# Patient Record
Sex: Male | Born: 1980 | Race: White | Hispanic: No | Marital: Single | Smoking: Never smoker
Health system: Southern US, Community
[De-identification: ages and names within clinical notes are randomized; demographics above are authoritative.]

## PROBLEM LIST (undated history)

## (undated) DIAGNOSIS — N200 Calculus of kidney: Secondary | ICD-10-CM

## (undated) DIAGNOSIS — N289 Disorder of kidney and ureter, unspecified: Secondary | ICD-10-CM

---

## 2018-02-23 ENCOUNTER — Other Ambulatory Visit: Payer: Self-pay

## 2018-02-23 ENCOUNTER — Encounter (HOSPITAL_COMMUNITY): Payer: Self-pay | Admitting: *Deleted

## 2018-02-23 ENCOUNTER — Emergency Department (HOSPITAL_COMMUNITY)
Admission: EM | Admit: 2018-02-23 | Discharge: 2018-02-24 | Disposition: A | Discharge status: Home / Self Care | Payer: BLUE CROSS/BLUE SHIELD | Attending: Emergency Medicine | Admitting: Emergency Medicine

## 2018-02-23 DIAGNOSIS — R1012 Left upper quadrant pain: Secondary | ICD-10-CM | POA: Diagnosis present

## 2018-02-23 DIAGNOSIS — N201 Calculus of ureter: Secondary | ICD-10-CM | POA: Insufficient documentation

## 2018-02-23 DIAGNOSIS — N23 Unspecified renal colic: Secondary | ICD-10-CM

## 2018-02-23 DIAGNOSIS — Z79899 Other long term (current) drug therapy: Secondary | ICD-10-CM | POA: Insufficient documentation

## 2018-02-23 LAB — COMPREHENSIVE METABOLIC PANEL
ALK PHOS: 61 U/L (ref 38–126)
ALT: 22 U/L (ref 0–44)
ANION GAP: 11 (ref 5–15)
AST: 16 U/L (ref 15–41)
Albumin: 4.7 g/dL (ref 3.5–5.0)
BILIRUBIN TOTAL: 0.7 mg/dL (ref 0.3–1.2)
BUN: 19 mg/dL (ref 6–20)
CALCIUM: 9.4 mg/dL (ref 8.9–10.3)
CO2: 24 mmol/L (ref 22–32)
CREATININE: 1.69 mg/dL — AB (ref 0.61–1.24)
Chloride: 104 mmol/L (ref 98–111)
GFR calc non Af Amer: 49 mL/min — ABNORMAL LOW (ref >60)
GFR, EST AFRICAN AMERICAN: 57 mL/min — AB (ref >60)
Glucose, Bld: 123 mg/dL — ABNORMAL HIGH (ref 70–99)
Potassium: 3.9 mmol/L (ref 3.5–5.1)
Sodium: 139 mmol/L (ref 135–145)
TOTAL PROTEIN: 8.1 g/dL (ref 6.5–8.1)

## 2018-02-23 LAB — CBC WITH DIFFERENTIAL/PLATELET
BASOS PCT: 0 %
Basophils Absolute: 0 10*3/uL (ref 0.0–0.1)
EOS ABS: 0.1 10*3/uL (ref 0.0–0.7)
EOS PCT: 1 %
HCT: 43.3 % (ref 39.0–52.0)
HEMOGLOBIN: 14.5 g/dL (ref 13.0–17.0)
Lymphocytes Relative: 7 %
Lymphs Abs: 1.2 10*3/uL (ref 0.7–4.0)
MCH: 30.2 pg (ref 26.0–34.0)
MCHC: 33.5 g/dL (ref 30.0–36.0)
MCV: 90.2 fL (ref 78.0–100.0)
MONOS PCT: 5 %
Monocytes Absolute: 0.8 10*3/uL (ref 0.1–1.0)
NEUTROS PCT: 87 %
Neutro Abs: 14.1 10*3/uL — ABNORMAL HIGH (ref 1.7–7.7)
PLATELETS: 265 10*3/uL (ref 150–400)
RBC: 4.8 MIL/uL (ref 4.22–5.81)
RDW: 12.9 % (ref 11.5–15.5)
WBC: 16.3 10*3/uL — AB (ref 4.0–10.5)

## 2018-02-23 LAB — LIPASE, BLOOD: Lipase: 29 U/L (ref 11–51)

## 2018-02-23 MED ORDER — KETOROLAC TROMETHAMINE 30 MG/ML IJ SOLN
15.0000 mg | Freq: Once | INTRAMUSCULAR | Status: AC
  Administered 2018-02-23: 15 mg via INTRAVENOUS
  Filled 2018-02-23: qty 1

## 2018-02-23 MED ORDER — HYDROMORPHONE HCL 1 MG/ML IJ SOLN
1.0000 mg | INTRAMUSCULAR | Status: AC | PRN
  Administered 2018-02-23: 1 mg via INTRAVENOUS
  Filled 2018-02-23: qty 1

## 2018-02-23 MED ORDER — ONDANSETRON HCL 4 MG/2ML IJ SOLN
4.0000 mg | Freq: Once | INTRAMUSCULAR | Status: AC
  Administered 2018-02-23: 4 mg via INTRAVENOUS
  Filled 2018-02-23: qty 2

## 2018-02-23 MED ORDER — SODIUM CHLORIDE 0.9 % IV SOLN
INTRAVENOUS | Status: DC
  Administered 2018-02-23: 23:00:00 via INTRAVENOUS

## 2018-02-23 MED ORDER — SODIUM CHLORIDE 0.9 % IV BOLUS
500.0000 mL | Freq: Once | INTRAVENOUS | Status: AC
  Administered 2018-02-23: 1000 mL via INTRAVENOUS

## 2018-02-23 NOTE — ED Provider Notes (Signed)
[facility] EMERGENCY DEPARTMENT Provider Note   CSN: [id number] Arrival date & time: 02/23/18  2152     History   Chief Complaint Chief Complaint  Patient presents with  . Flank Pain    HPI Dionicio StallJoey Capelle is a 37 y.o. male.  HPI Patient presents to the emergency room for evaluation of acute onset flank pain.  Patient states his symptoms started about 3 hours ago.  He was not doing anything in particular.  He denies any heavy lifting or exertion.  Pain was sharp and it persisted.  Patient continues to have severe right flank pain.  The symptoms feel similar to previous kidney stones.  He denies any fevers.  No nausea vomiting.  No numbness or weakness.  No shortness of breath. History reviewed. No pertinent past medical history.  There are no active problems to display for this patient.   History reviewed. No pertinent surgical history.      Home Medications    Prior to Admission medications   Medication Sig Start Date End Date Taking? Authorizing Provider  ibuprofen (ADVIL,MOTRIN) 200 MG tablet Take 1,600 mg by mouth once as needed for mild pain or moderate pain.   Yes [provider]    Family History History reviewed. No pertinent family history.  Social History Social History   Tobacco Use  . Smoking status: Never Smoker  . Smokeless tobacco: Never Used  Substance Use Topics  . Alcohol use: Yes    Comment: occasionally  . Drug use: Never     Allergies   Patient has no known allergies.   Review of Systems Review of Systems  All other systems reviewed and are negative.    Physical Exam Updated Vital Signs BP (!) 141/80   Pulse 100   Temp (!) 97.5 F (36.4 C) (Oral)   Resp 20   Ht 1.702 m (5\' 7" )   Wt 88.9 kg (196 lb)   SpO2 97%   BMI 30.70 kg/m   Physical Exam  Constitutional: He appears well-developed and well-nourished. He appears distressed.  HENT:  Head: Normocephalic and atraumatic.  Right Ear: External ear normal.  Left  Ear: External ear normal.  Eyes: Conjunctivae are normal. Right eye exhibits no discharge. Left eye exhibits no discharge. No scleral icterus.  Neck: Neck supple. No tracheal deviation present.  Cardiovascular: Normal rate, regular rhythm and intact distal pulses.  Pulmonary/Chest: Effort normal and breath sounds normal. No stridor. No respiratory distress. He has no wheezes. He has no rales.  Abdominal: Soft. Bowel sounds are normal. He exhibits no distension. There is no tenderness. There is CVA tenderness (right). There is no rebound and no guarding.  Musculoskeletal: He exhibits no edema or tenderness.  Neurological: He is alert. He has normal strength. No cranial nerve deficit (no facial droop, extraocular movements intact, no slurred speech) or sensory deficit. He exhibits normal muscle tone. He displays no seizure activity. Coordination normal.  Skin: Skin is warm and dry. No rash noted.  Psychiatric: He has a normal mood and affect.  Nursing note and vitals reviewed.    ED Treatments / Results  Labs (all labs ordered are listed, but only abnormal results are displayed) Labs Reviewed  COMPREHENSIVE METABOLIC PANEL - Abnormal; Notable for the following components:      Result Value   Glucose, Bld 123 (*)    Creatinine, Ser 1.69 (*)    GFR calc non Af Amer 49 (*)    GFR calc Af Amer 57 (*)  All other components within normal limits  CBC WITH DIFFERENTIAL/PLATELET - Abnormal; Notable for the following components:   WBC 16.3 (*)    Neutro Abs 14.1 (*)    All other components within normal limits  URINALYSIS, ROUTINE W REFLEX MICROSCOPIC - Abnormal; Notable for the following components:   APPearance HAZY (*)    Specific Gravity, Urine 1.034 (*)    Hgb urine dipstick LARGE (*)    Ketones, ur 5 (*)    Protein, ur 30 (*)    RBC / HPF >50 (*)    All other components within normal limits  LIPASE, BLOOD    EKG None  Radiology No results found.  Procedures Procedures  (including critical care time)  Medications Ordered in ED Medications  sodium chloride 0.9 % bolus 500 mL (0 mLs Intravenous Stopped 02/24/18 0039)    And  0.9 %  sodium chloride infusion ( Intravenous New Bag/Given 02/23/18 2309)  HYDROmorphone (DILAUDID) injection 1 mg (1 mg Intravenous Given 02/24/18 0044)  HYDROmorphone (DILAUDID) injection 1 mg (1 mg Intravenous Given 02/23/18 2307)  ondansetron (ZOFRAN) injection 4 mg (4 mg Intravenous Given 02/23/18 2307)  ketorolac (TORADOL) 30 MG/ML injection 15 mg (15 mg Intravenous Given 02/23/18 2346)  HYDROmorphone (DILAUDID) injection 1 mg (1 mg Intravenous Given 02/24/18 0011)     Initial Impression / Assessment and Plan / ED Course  I have reviewed the triage vital signs and the nursing notes.  Pertinent labs & imaging results that were available during my care of the patient were reviewed by me and considered in my medical decision making (see chart for details).  Clinical Course as of Feb 25 43  Mon Feb 23, 2018  2232 [place] pmp awareL  last rx oct 2018. Overdose risk score 170   [JK]  Tue Feb 24, 2018  0001 Pt is still having pain.  Will give additional meds   [JK]  0037 Still having pain.  Will CT to evaluate further, additional meds ordered   [JK]    Clinical Course User Index [JK] Linwood Dibbles, MD    Patient presented to the emergency room for evaluation of flank pain.  Symptoms are consistent with renal colic.  Patient's laboratory tests were notable for hematuria as well as an elevated creatinine.  Patient was treated with pain medications.  Unfortunately symptoms persisted.  CT scan has been ordered for further evaluation and additional medications have also been ordered.  Case will be turned over to Dr. Blinda Leatherwood.  Final Clinical Impressions(s) / ED Diagnoses   Final diagnoses:  Renal colic    ED Discharge Orders    None       Linwood Dibbles, MD 02/24/18 [phone]

## 2018-02-23 NOTE — ED Triage Notes (Signed)
Pt c/o severe pain to right flank that started x 3hours ago; pt has a hx of kidney stones

## 2018-02-23 NOTE — ED Triage Notes (Signed)
Pt has a history of kidney stones x2. Stated this is the third time this has happened. Right flank pain started around 7pm. Said the pain was so  Bad he couldn't finish work and had to call his dad to pick him up. 10/10 severe pain.

## 2018-02-24 ENCOUNTER — Emergency Department (HOSPITAL_COMMUNITY): Payer: BLUE CROSS/BLUE SHIELD

## 2018-02-24 LAB — URINALYSIS, ROUTINE W REFLEX MICROSCOPIC
Bacteria, UA: NONE SEEN
Bilirubin Urine: NEGATIVE
GLUCOSE, UA: NEGATIVE mg/dL
KETONES UR: 5 mg/dL — AB
Leukocytes, UA: NEGATIVE
Nitrite: NEGATIVE
PROTEIN: 30 mg/dL — AB
RBC / HPF: >50 RBC/hpf — ABNORMAL HIGH (ref 0–5)
Specific Gravity, Urine: 1.034 — ABNORMAL HIGH (ref 1.005–1.030)
pH: 5 (ref 5.0–8.0)

## 2018-02-24 MED ORDER — HYDROMORPHONE HCL 1 MG/ML IJ SOLN
1.0000 mg | INTRAMUSCULAR | Status: AC | PRN
  Administered 2018-02-24: 1 mg via INTRAVENOUS
  Filled 2018-02-24: qty 1

## 2018-02-24 MED ORDER — KETOROLAC TROMETHAMINE 30 MG/ML IJ SOLN
INTRAMUSCULAR | Status: AC
  Filled 2018-02-24: qty 1

## 2018-02-24 MED ORDER — TAMSULOSIN HCL 0.4 MG PO CAPS: 0.4000 mg | ORAL_CAPSULE | Freq: Every day | ORAL | 0 refills | Status: AC

## 2018-02-24 MED ORDER — ONDANSETRON HCL 4 MG/2ML IJ SOLN
4.0000 mg | Freq: Once | INTRAMUSCULAR | Status: AC
  Administered 2018-02-24: 4 mg via INTRAVENOUS

## 2018-02-24 MED ORDER — ONDANSETRON HCL 4 MG/2ML IJ SOLN
INTRAMUSCULAR | Status: AC
  Filled 2018-02-24: qty 2

## 2018-02-24 MED ORDER — HYDROMORPHONE HCL 1 MG/ML IJ SOLN
1.0000 mg | INTRAMUSCULAR | Status: DC | PRN
Start: 2018-02-24 — End: 2018-02-24
  Administered 2018-02-24: 1 mg via INTRAVENOUS
  Filled 2018-02-24: qty 1

## 2018-02-24 MED ORDER — KETOROLAC TROMETHAMINE 30 MG/ML IJ SOLN
15.0000 mg | Freq: Once | INTRAMUSCULAR | Status: AC
  Administered 2018-02-24: 15 mg via INTRAVENOUS

## 2018-02-24 MED ORDER — OXYCODONE-ACETAMINOPHEN 5-325 MG PO TABS: 1.0000 | ORAL_TABLET | ORAL | 0 refills | Status: DC | PRN

## 2018-02-24 NOTE — ED Notes (Signed)
ED Provider at bedside. 

## 2018-02-24 NOTE — ED Provider Notes (Signed)
Patient signed out to me to follow-up on CT scan.  Patient seen with flank pain, presumed renal colic.  CT scan does confirm an obstructing 2 mm stone at the right ureterovesical junction with moderate hydronephrosis.  Will be initiated on Flomax, Percocet.  He will follow-up with urology.   Gilda CreasePollina, Christopher J, MD 02/24/18 [phone]

## 2018-02-25 MED FILL — Oxycodone w/ Acetaminophen Tab 5-325 MG: ORAL | Qty: 6 | Status: AC

## 2020-03-10 IMAGING — CT CT RENAL STONE PROTOCOL
2 of 4 series · 16 of 46 positions shown, 18 images · non-contrast
Comparison: None.

CLINICAL DATA: Flank pain, stone disease suspected. Pain initially
on the left now migrated to the right. History of kidney stones.

EXAM:
CT ABDOMEN AND PELVIS WITHOUT CONTRAST
TECHNIQUE: Multidetector CT imaging of the abdomen and pelvis was performed
following the standard protocol without IV contrast.

[Series 2: axial st · axial · 0.77mm/px · z∈[-934,-474]mm · 13 of 100 slices shown, 15 images]
[im 4/100  soft-tissue]
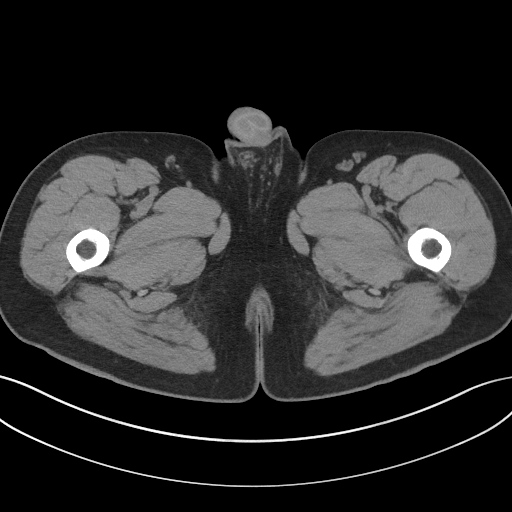
[im 4/100  bone]
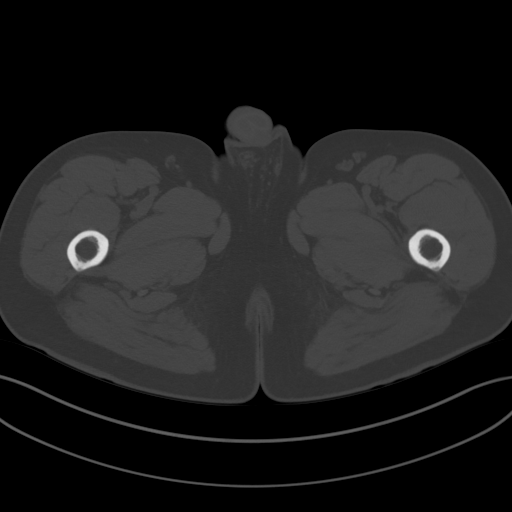
[im 12/100  soft-tissue]
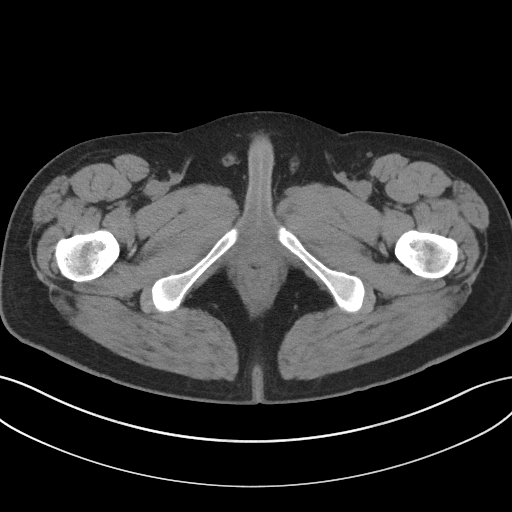
[im 20/100  soft-tissue]
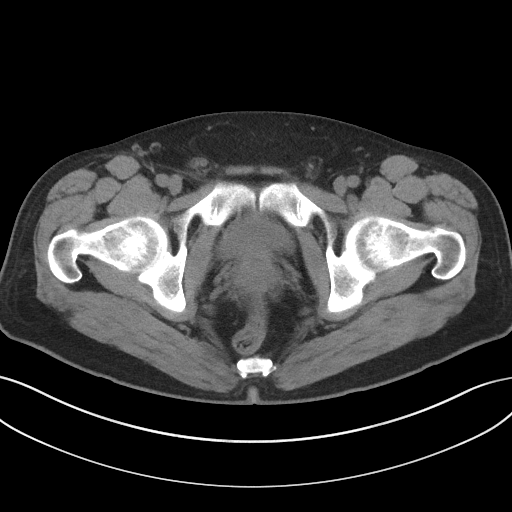
[im 28/100  soft-tissue]
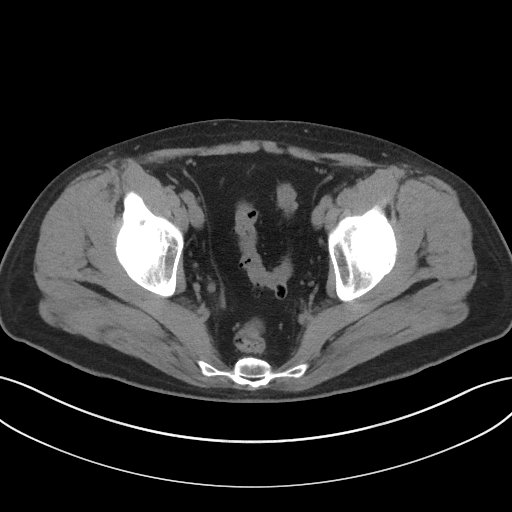
[im 36/100  soft-tissue]
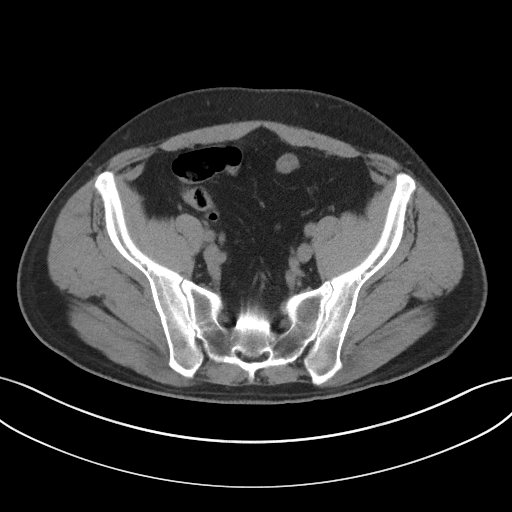
[im 44/100  soft-tissue]
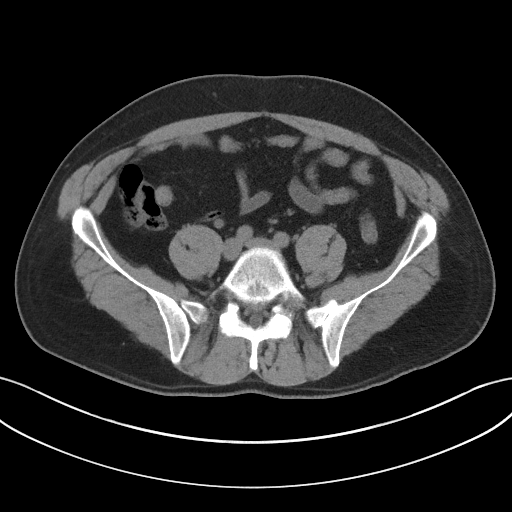
[im 52/100  soft-tissue]
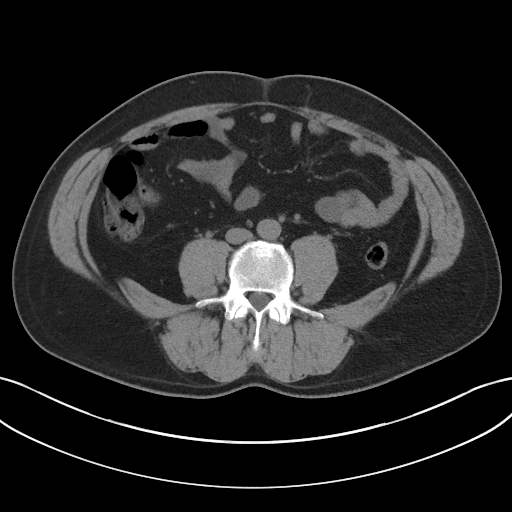
[im 56/100  soft-tissue]
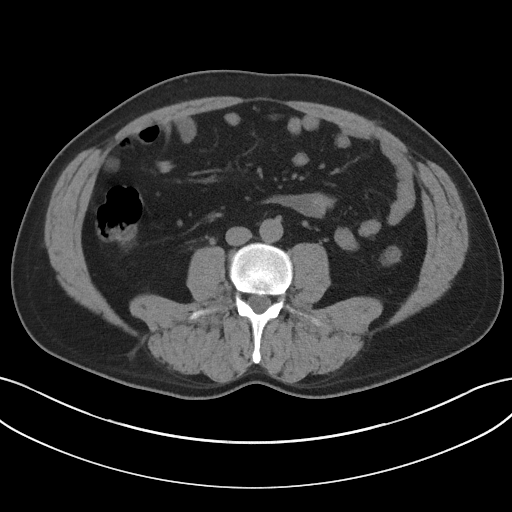
[im 64/100  soft-tissue]
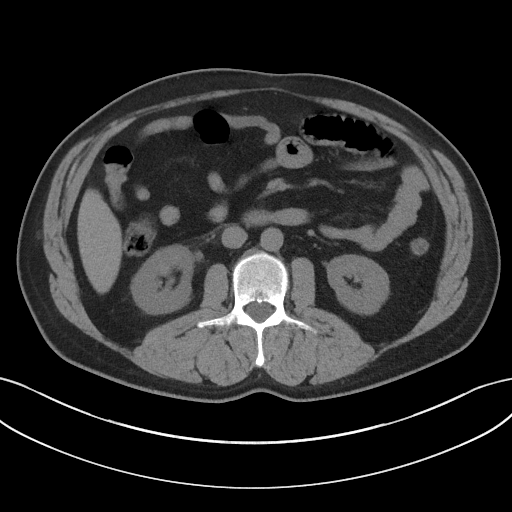
[im 64/100  bone]
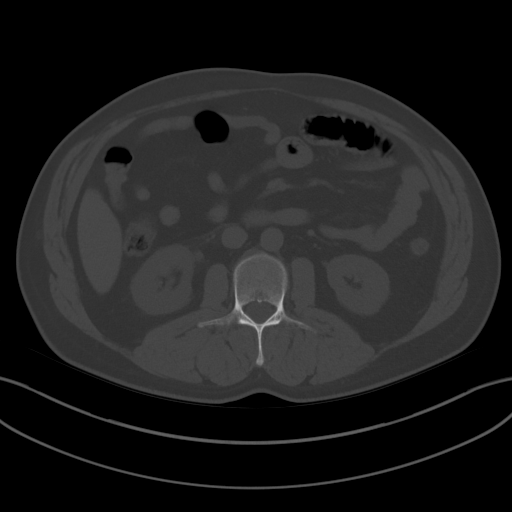
[im 72/100  soft-tissue]
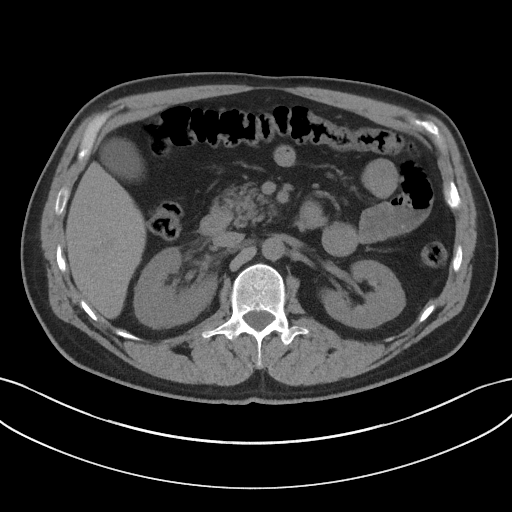
[im 80/100  soft-tissue]
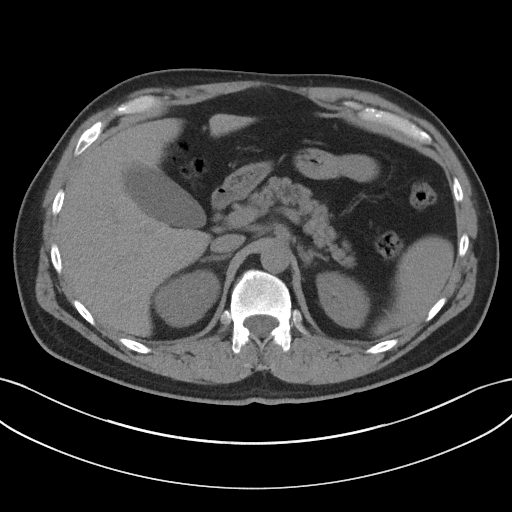
[im 88/100  soft-tissue]
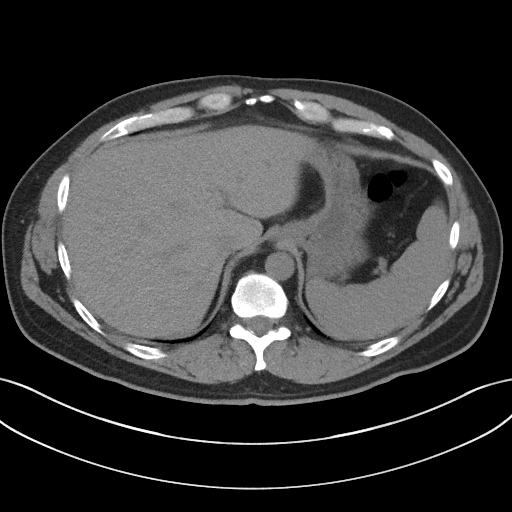
[im 96/100  soft-tissue]
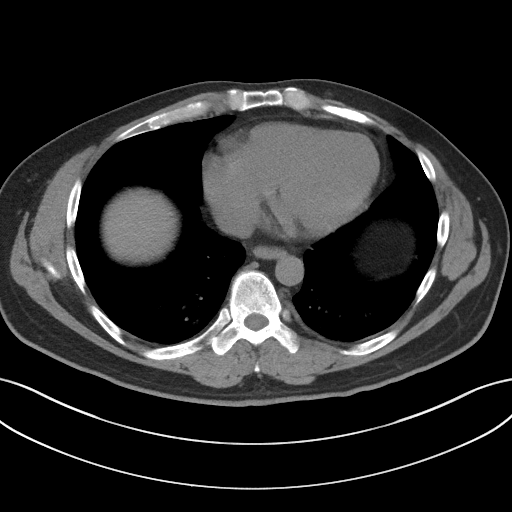

[Series 5: coronal st · coronal · 0.84mm/px · 3 of 93 slices shown]
[im 31/93  soft-tissue]
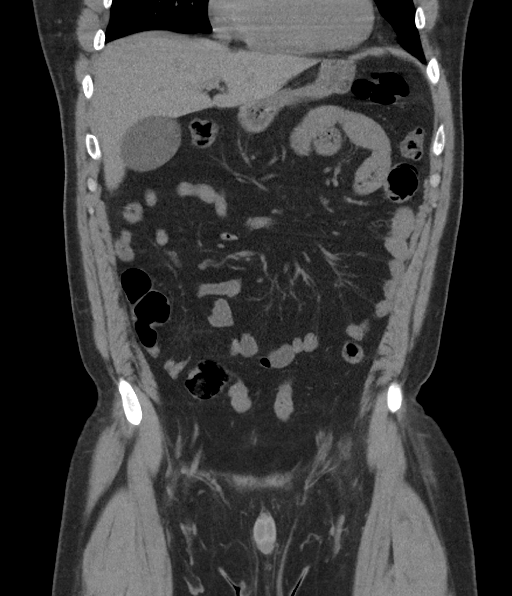
[im 41/93  soft-tissue]
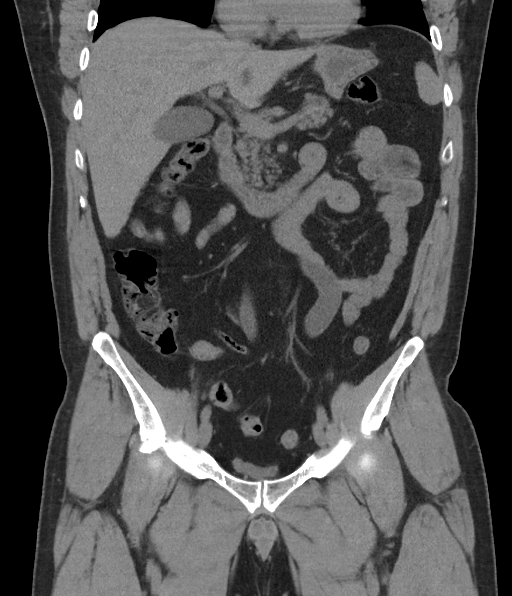
[im 52/93  soft-tissue]
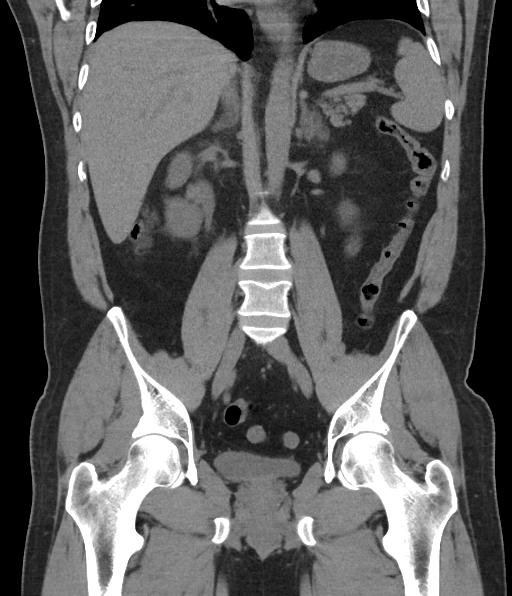

[16 of 46 positions shown; findings below may reference images not displayed]

FINDINGS: Lower chest: Hypoventilatory changes at the lung bases. No pleural
fluid.

Hepatobiliary: No focal liver abnormality is seen. No gallstones,
gallbladder wall thickening, or biliary dilatation.

Pancreas: No ductal dilatation or inflammation.

Spleen: Normal in size without focal abnormality.

Adrenals/Urinary Tract: Normal adrenal glands.

Punctate, approximately 2 mm, obstructing stone at the right
ureterovesicular junction with moderate right hydroureteronephrosis.
Mild right perinephric edema. Additional punctate nonobstructing
stone in the mid right kidney.

No left hydronephrosis. No left renal stones. The left ureter is
decompressed. Urinary bladder is partially distended. No bladder
stone.

Stomach/Bowel: Mild sigmoid colonic diverticulosis without
diverticulitis. No bowel wall thickening, inflammatory change or
obstruction. Normal appendix.

Vascular/Lymphatic: Normal course and caliber of noncontrast
abdominal vasculature. No adenopathy.

Reproductive: Prostate is unremarkable.

Other: No free air, free fluid, or intra-abdominal fluid collection.
Small fat containing umbilical hernia.

Musculoskeletal: Bilateral L5 pars interarticularis defects with
trace anterolisthesis of L5 on S1. There are no acute or suspicious
osseous abnormalities.
IMPRESSION: 1. Obstructing 2 mm stone at the right ureterovesicular junction
with moderate hydronephrosis.
2. Punctate nonobstructing right renal stone.
3. Incidental findings of colonic diverticulosis without
diverticulitis. Bilateral L5 pars interarticularis defects with
minimal anterolisthesis of L5 on S1.

## 2020-04-19 ENCOUNTER — Other Ambulatory Visit: Payer: Self-pay

## 2020-04-19 ENCOUNTER — Emergency Department (HOSPITAL_COMMUNITY)
Admission: EM | Admit: 2020-04-19 | Discharge: 2020-04-19 | Disposition: A | Discharge status: Home / Self Care | Payer: BC Managed Care – PPO | Attending: Emergency Medicine | Admitting: Emergency Medicine

## 2020-04-19 ENCOUNTER — Encounter (HOSPITAL_COMMUNITY): Payer: Self-pay | Admitting: Emergency Medicine

## 2020-04-19 ENCOUNTER — Emergency Department (HOSPITAL_COMMUNITY): Payer: BC Managed Care – PPO

## 2020-04-19 DIAGNOSIS — N200 Calculus of kidney: Secondary | ICD-10-CM | POA: Insufficient documentation

## 2020-04-19 DIAGNOSIS — R109 Unspecified abdominal pain: Secondary | ICD-10-CM | POA: Diagnosis present

## 2020-04-19 HISTORY — DX: Disorder of kidney and ureter, unspecified: N28.9

## 2020-04-19 HISTORY — DX: Calculus of kidney: N20.0

## 2020-04-19 LAB — CBC WITH DIFFERENTIAL/PLATELET
Abs Immature Granulocytes: 0.06 10*3/uL (ref 0.00–0.07)
Basophils Absolute: 0.1 10*3/uL (ref 0.0–0.1)
Basophils Relative: 1 %
Eosinophils Absolute: 0.3 10*3/uL (ref 0.0–0.5)
Eosinophils Relative: 2 %
HCT: 44.4 % (ref 39.0–52.0)
Hemoglobin: 14.4 g/dL (ref 13.0–17.0)
Immature Granulocytes: 0 %
Lymphocytes Relative: 10 %
Lymphs Abs: 1.6 10*3/uL (ref 0.7–4.0)
MCH: 30.1 pg (ref 26.0–34.0)
MCHC: 32.4 g/dL (ref 30.0–36.0)
MCV: 92.9 fL (ref 80.0–100.0)
Monocytes Absolute: 0.8 10*3/uL (ref 0.1–1.0)
Monocytes Relative: 5 %
Neutro Abs: 12.9 10*3/uL — ABNORMAL HIGH (ref 1.7–7.7)
Neutrophils Relative %: 82 %
Platelets: 257 10*3/uL (ref 150–400)
RBC: 4.78 MIL/uL (ref 4.22–5.81)
RDW: 12.9 % (ref 11.5–15.5)
WBC: 15.8 10*3/uL — ABNORMAL HIGH (ref 4.0–10.5)
nRBC: 0 % (ref 0.0–0.2)

## 2020-04-19 LAB — URINALYSIS, ROUTINE W REFLEX MICROSCOPIC
Bacteria, UA: NONE SEEN
Bilirubin Urine: NEGATIVE
Glucose, UA: NEGATIVE mg/dL
Ketones, ur: NEGATIVE mg/dL
Leukocytes,Ua: NEGATIVE
Nitrite: NEGATIVE
Protein, ur: NEGATIVE mg/dL
RBC / HPF: >50 RBC/hpf — ABNORMAL HIGH (ref 0–5)
Specific Gravity, Urine: 1.029 (ref 1.005–1.030)
pH: 5 (ref 5.0–8.0)

## 2020-04-19 LAB — BASIC METABOLIC PANEL
Anion gap: 12 (ref 5–15)
BUN: 20 mg/dL (ref 6–20)
CO2: 26 mmol/L (ref 22–32)
Calcium: 9.7 mg/dL (ref 8.9–10.3)
Chloride: 104 mmol/L (ref 98–111)
Creatinine, Ser: 1.38 mg/dL — ABNORMAL HIGH (ref 0.61–1.24)
GFR calc Af Amer: >60 mL/min (ref >60)
GFR calc non Af Amer: >60 mL/min (ref >60)
Glucose, Bld: 116 mg/dL — ABNORMAL HIGH (ref 70–99)
Potassium: 4 mmol/L (ref 3.5–5.1)
Sodium: 142 mmol/L (ref 135–145)

## 2020-04-19 MED ORDER — MORPHINE SULFATE (PF) 4 MG/ML IV SOLN
4.0000 mg | Freq: Once | INTRAVENOUS | Status: AC
  Administered 2020-04-19: 4 mg via INTRAVENOUS
  Filled 2020-04-19: qty 1

## 2020-04-19 MED ORDER — KETOROLAC TROMETHAMINE 30 MG/ML IJ SOLN
30.0000 mg | Freq: Once | INTRAMUSCULAR | Status: AC
  Administered 2020-04-19: 30 mg via INTRAVENOUS
  Filled 2020-04-19: qty 1

## 2020-04-19 MED ORDER — ONDANSETRON HCL 4 MG/2ML IJ SOLN
4.0000 mg | Freq: Once | INTRAMUSCULAR | Status: AC
  Administered 2020-04-19: 4 mg via INTRAVENOUS
  Filled 2020-04-19: qty 2

## 2020-04-19 MED ORDER — HYDROMORPHONE HCL 1 MG/ML IJ SOLN
1.0000 mg | Freq: Once | INTRAMUSCULAR | Status: AC
  Administered 2020-04-19: 1 mg via INTRAVENOUS
  Filled 2020-04-19: qty 1

## 2020-04-19 MED ORDER — OXYCODONE-ACETAMINOPHEN 5-325 MG PO TABS: 1.0000 | ORAL_TABLET | Freq: Four times a day (QID) | ORAL | 0 refills | Status: AC | PRN

## 2020-04-19 NOTE — Discharge Instructions (Addendum)
Begin taking Percocet as prescribed as needed for pain.  Follow-up with urology if the stone has not passed in the next few days.  The contact information for the [facility] in [place] has been provided in this discharge summary for you to call and make these arrangements.  Return to the emergency department in the meantime if you develop worsening pain, high fever, or other new and concerning symptoms.

## 2020-04-19 NOTE — ED Provider Notes (Signed)
[facility] EMERGENCY DEPARTMENT Provider Note   CSN: [id number] Arrival date & time: 04/19/20  8657     History Chief Complaint  Patient presents with  . Flank Pain    Frank Wolf is a 39 y.o. male.  Patient is a 39 year old male with history of kidney stones.  He presents today for evaluation of right flank pain.  This started acutely at approximately midnight and woke him from sleep.  He describes a severe, sharp, pain to the right flank that waxes and wanes.  He feels nauseated but has not vomited.  He denies bowel or bladder complaints.  Denies fevers or chills.  The history is provided by the patient.  Flank Pain This is a new problem. The current episode started 3 to 5 hours ago. The problem occurs constantly. The problem has been rapidly worsening. Nothing aggravates the symptoms. Nothing relieves the symptoms. He has tried nothing for the symptoms.       Past Medical History:  Diagnosis Date  . Kidney stones   . Renal disorder     There are no problems to display for this patient.   History reviewed. No pertinent surgical history.     No family history on file.  Social History   Tobacco Use  . Smoking status: Never Smoker  . Smokeless tobacco: Never Used  Substance Use Topics  . Alcohol use: Yes    Comment: occasionally  . Drug use: Never    Home Medications Prior to Admission medications   Medication Sig Start Date End Date Taking? Authorizing Provider  ibuprofen (ADVIL,MOTRIN) 200 MG tablet Take 1,600 mg by mouth once as needed for mild pain or moderate pain.    [provider]  oxyCODONE-acetaminophen (PERCOCET) 5-325 MG tablet Take 1-2 tablets by mouth every 4 (four) hours as needed. 02/24/18   Gilda Crease, MD  oxyCODONE-acetaminophen (PERCOCET) 5-325 MG tablet Take 1-2 tablets by mouth every 4 (four) hours as needed. 02/24/18   Gilda Crease, MD  tamsulosin (FLOMAX) 0.4 MG CAPS capsule Take 1 capsule (0.4 mg total) by  mouth daily. 02/24/18   Gilda Crease, MD    Allergies    Patient has no known allergies.  Review of Systems   Review of Systems  Genitourinary: Positive for flank pain.  All other systems reviewed and are negative.   Physical Exam Updated Vital Signs BP (!) 165/97 (BP Location: Left Arm)   Pulse 69   Temp 98 F (36.7 C)   Resp 16   Ht 5\' 8"  (1.727 m)   Wt 93 kg   SpO2 98%   BMI 31.17 kg/m   Physical Exam Vitals and nursing note reviewed.  Constitutional:      General: He is not in acute distress.    Appearance: He is well-developed. He is not diaphoretic.  HENT:     Head: Normocephalic and atraumatic.  Cardiovascular:     Rate and Rhythm: Normal rate and regular rhythm.     Heart sounds: No murmur heard.  No friction rub.  Pulmonary:     Effort: Pulmonary effort is normal. No respiratory distress.     Breath sounds: Normal breath sounds. No wheezing or rales.  Abdominal:     General: Bowel sounds are normal. There is no distension.     Palpations: Abdomen is soft.     Tenderness: There is no abdominal tenderness. There is right CVA tenderness.  Musculoskeletal:        General: Normal range  of motion.     Cervical back: Normal range of motion and neck supple.  Skin:    General: Skin is warm and dry.  Neurological:     Mental Status: He is alert and oriented to person, place, and time.     Coordination: Coordination normal.     ED Results / Procedures / Treatments   Labs (all labs ordered are listed, but only abnormal results are displayed) Labs Reviewed  URINALYSIS, ROUTINE W REFLEX MICROSCOPIC  BASIC METABOLIC PANEL  CBC WITH DIFFERENTIAL/PLATELET    EKG None  Radiology No results found.  Procedures Procedures (including critical care time)  Medications Ordered in ED Medications  ketorolac (TORADOL) 30 MG/ML injection 30 mg (has no administration in time range)  morphine 4 MG/ML injection 4 mg (has no administration in time range)    ondansetron (ZOFRAN) injection 4 mg (has no administration in time range)    ED Course  I have reviewed the triage vital signs and the nursing notes.  Pertinent labs & imaging results that were available during my care of the patient were reviewed by me and considered in my medical decision making (see chart for details).    MDM Rules/Calculators/A&P  Patient presenting with complaints of sudden onset of right flank pain.  UA shows blood and renal CT consistent with a 2 x 6 mm stone located at the right UVJ.  He is feeling better after receiving medications here in the ER.  He does have an elevated white count, however is afebrile and no sign of infection in his urine.  I suspect the leukocytosis is related to stress/pain.  Patient will be discharged with pain medicine and follow-up with urology if not improving in the next few days.  Final Clinical Impression(s) / ED Diagnoses Final diagnoses:  None    Rx / DC Orders ED Discharge Orders    None       Geoffery Lyons, MD 04/19/20 [phone]

## 2020-04-19 NOTE — ED Triage Notes (Signed)
Pt c/o right flank pain since midnight.

## 2022-05-04 IMAGING — CT CT RENAL STONE PROTOCOL
2 of 3 series · 17 of 46 positions shown, 19 images · non-contrast
Comparison: 02/24/2018

CLINICAL DATA: Right flank pain since midnight

EXAM:
CT ABDOMEN AND PELVIS WITHOUT CONTRAST
TECHNIQUE: Multidetector CT imaging of the abdomen and pelvis was performed
following the standard protocol without IV contrast.

[Series 2: axial st · axial · 0.85mm/px · z∈[-687,-252]mm · 14 of 101 slices shown, 16 images]
[im 7/101  soft-tissue]
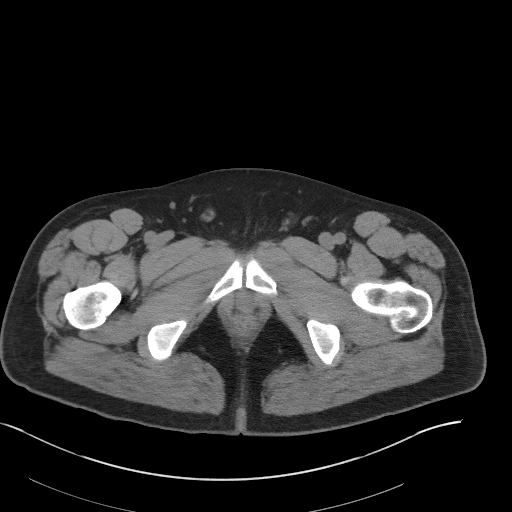
[im 7/101  bone]
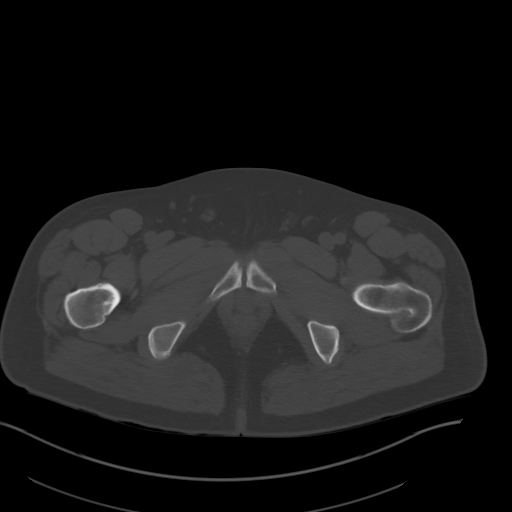
[im 13/101  soft-tissue]
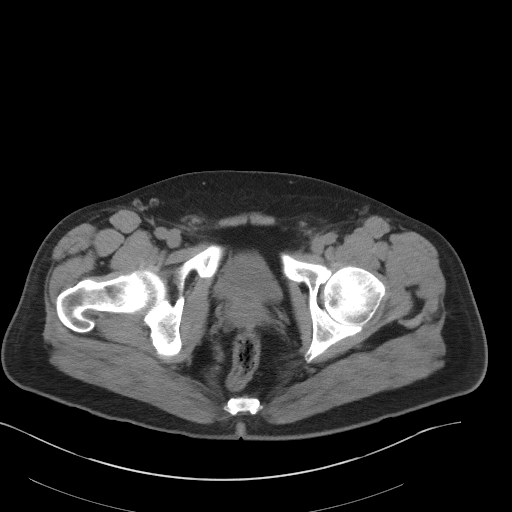
[im 20/101  soft-tissue]
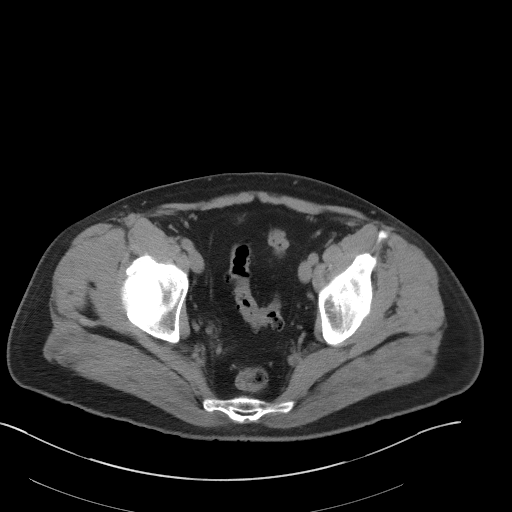
[im 26/101  soft-tissue]
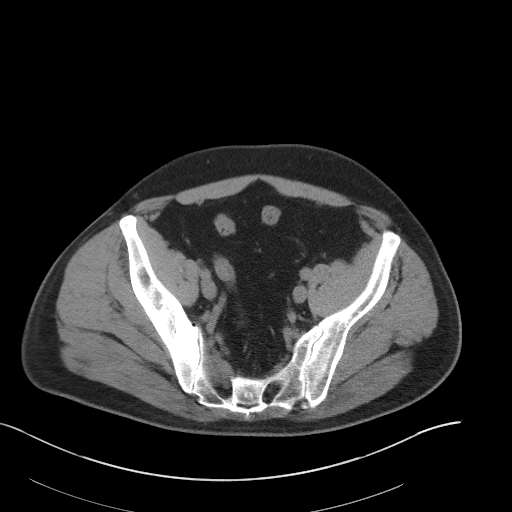
[im 33/101  soft-tissue]
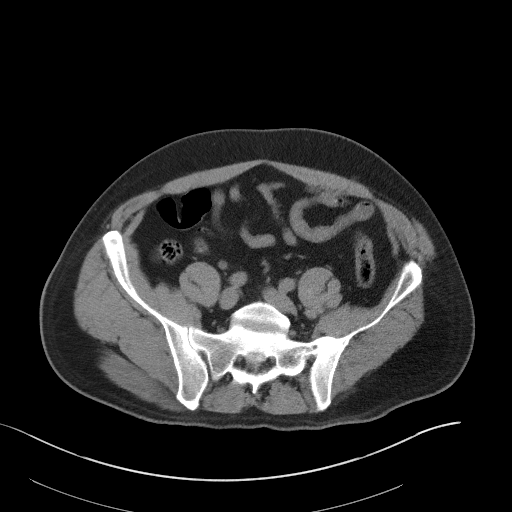
[im 39/101  soft-tissue]
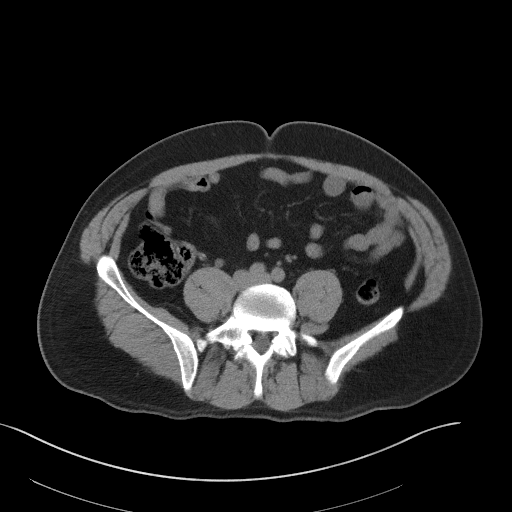
[im 46/101  soft-tissue]
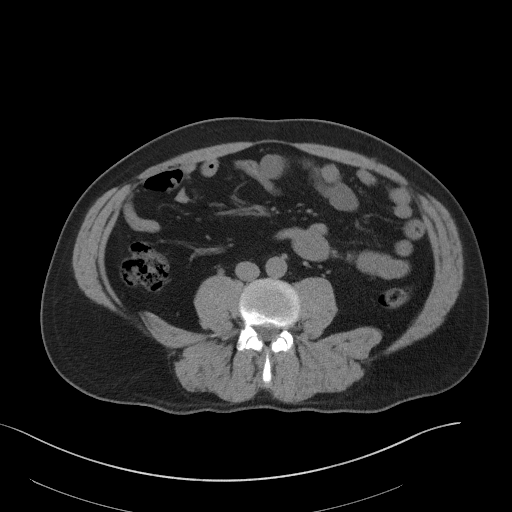
[im 55/101  soft-tissue]
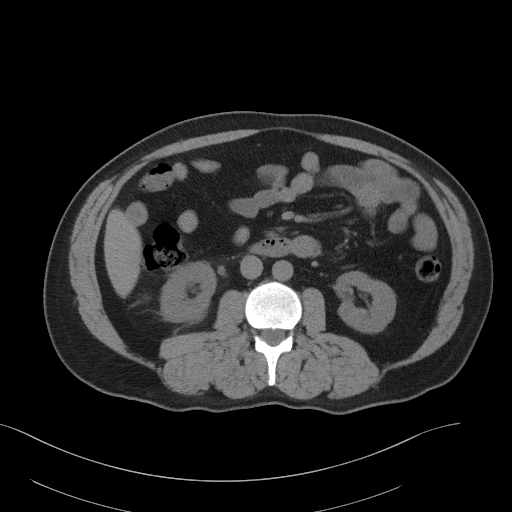
[im 62/101  soft-tissue]
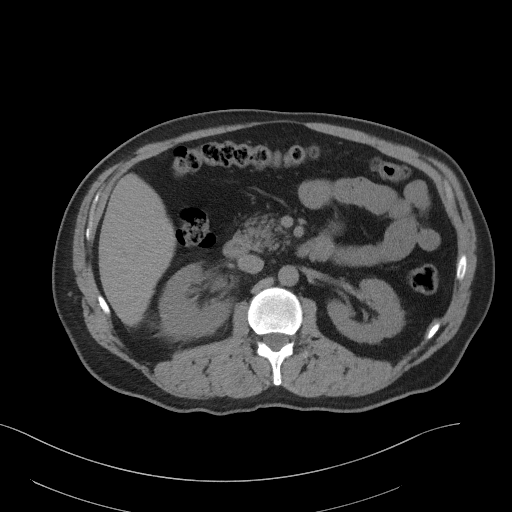
[im 62/101  bone]
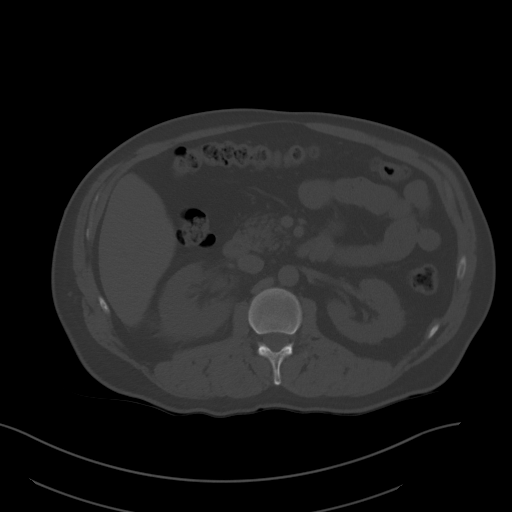
[im 68/101  soft-tissue]
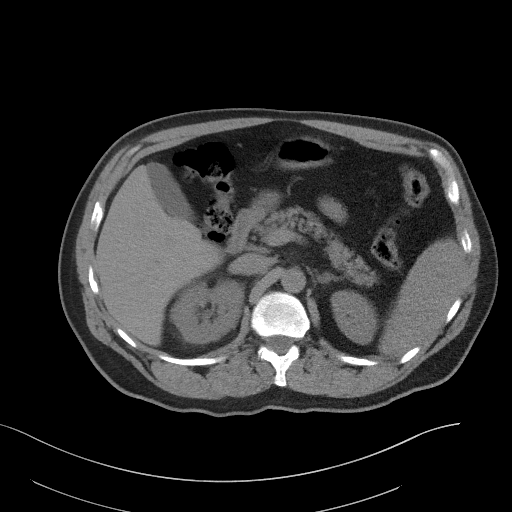
[im 75/101  soft-tissue]
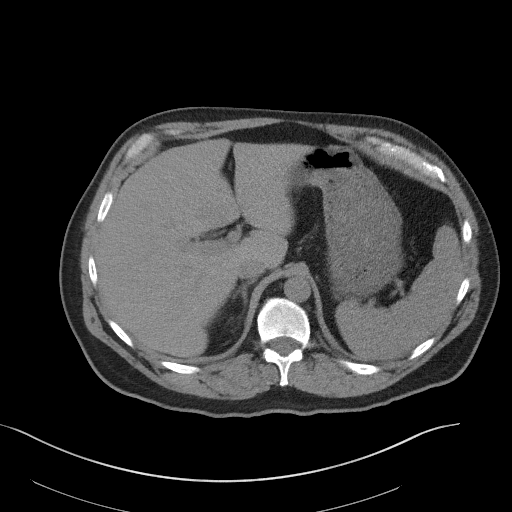
[im 81/101  soft-tissue]
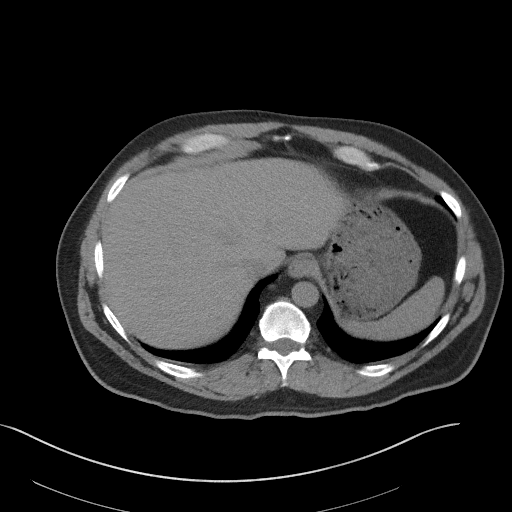
[im 88/101  soft-tissue]
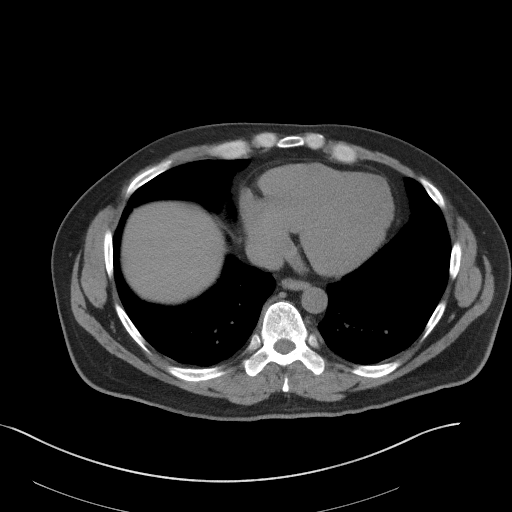
[im 94/101  soft-tissue]
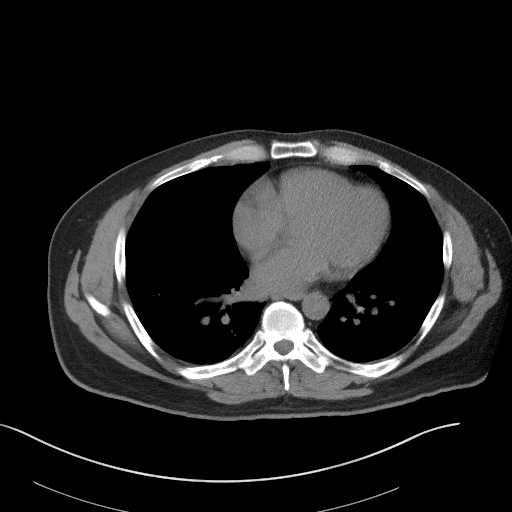

[Series 5: coronal st · coronal · 0.84mm/px · 3 of 101 slices shown]
[im 34/101  soft-tissue]
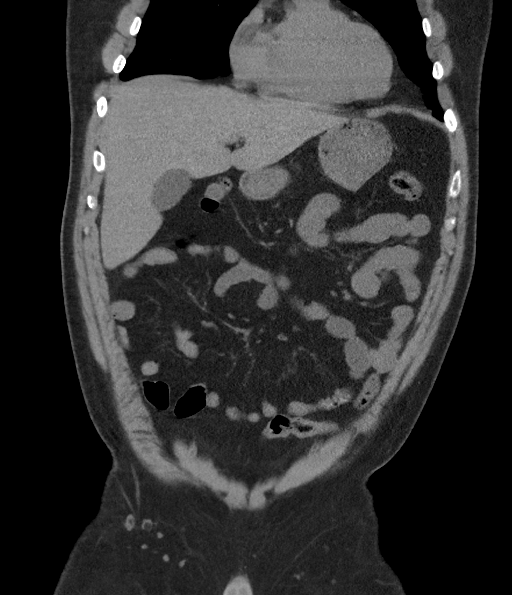
[im 45/101  soft-tissue]
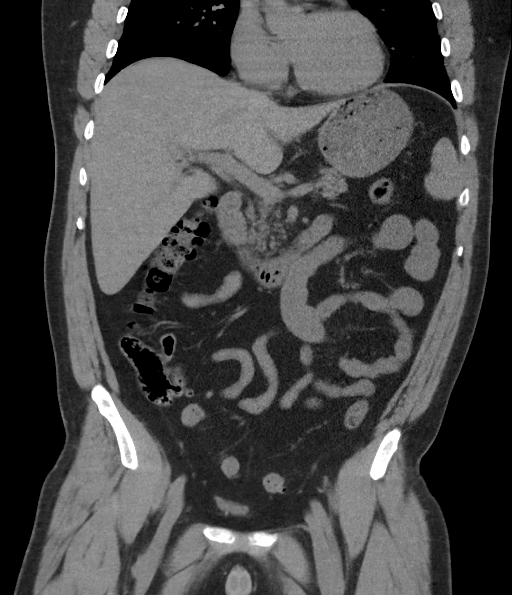
[im 56/101  soft-tissue]
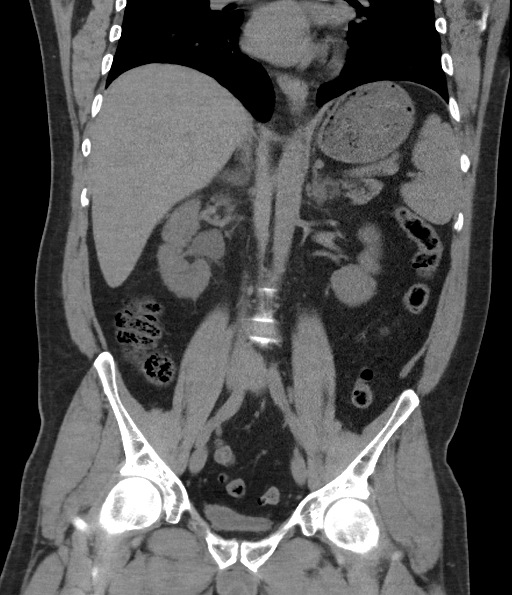

[17 of 46 positions shown; findings below may reference images not displayed]

FINDINGS: Lower chest:  No contributory findings.

Hepatobiliary: No focal liver abnormality.No evidence of biliary
obstruction or stone.

Pancreas: Unremarkable.

Spleen: Unremarkable.

Adrenals/Urinary Tract: Negative adrenals. Right
hydroureteronephrosis and asymmetric perinephric stranding due to a
6 x 2 mm stone the right UVJ. No additional urolithiasis.
Unremarkable bladder.

Stomach/Bowel: No obstruction. No appendicitis. There are a few
sigmoid diverticula.

Vascular/Lymphatic: No acute vascular abnormality. No mass or
adenopathy.

Reproductive:No pathologic findings.

Other: No ascites or pneumoperitoneum.

Musculoskeletal: No acute abnormalities. L5 chronic bilateral pars
defects with mild L5-S1 anterolisthesis.
IMPRESSION: Obstructing 6 x 2 mm stone at the right UVJ.
